# Patient Record
Sex: Female | Born: 2000 | Race: Black or African American | Hispanic: No | Marital: Single | Smoking: Current some day smoker
Health system: Southern US, Community
[De-identification: ages and names within clinical notes are randomized; demographics above are authoritative.]

---
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
## 2022-11-19 ENCOUNTER — Other Ambulatory Visit: Payer: Self-pay

## 2022-11-19 ENCOUNTER — Ambulatory Visit
Admission: EM | Admit: 2022-11-19 | Discharge: 2022-11-19 | Disposition: A | Discharge status: Home / Self Care | Payer: Medicaid Other | Attending: Emergency Medicine | Admitting: Emergency Medicine

## 2022-11-19 ENCOUNTER — Encounter: Payer: Self-pay | Admitting: *Deleted

## 2022-11-19 DIAGNOSIS — R112 Nausea with vomiting, unspecified: Secondary | ICD-10-CM | POA: Insufficient documentation

## 2022-11-19 DIAGNOSIS — K529 Noninfective gastroenteritis and colitis, unspecified: Secondary | ICD-10-CM | POA: Diagnosis not present

## 2022-11-19 LAB — POCT URINALYSIS DIP (MANUAL ENTRY)
Bilirubin, UA: NEGATIVE
Glucose, UA: NEGATIVE mg/dL
Ketones, POC UA: NEGATIVE mg/dL
Nitrite, UA: NEGATIVE
Protein Ur, POC: >=300 mg/dL — AB
Spec Grav, UA: 1.025 (ref 1.010–1.025)
Urobilinogen, UA: 0.2 U/dL
pH, UA: 5.5 (ref 5.0–8.0)

## 2022-11-19 LAB — POCT URINE PREGNANCY: Preg Test, Ur: NEGATIVE

## 2022-11-19 MED ORDER — ONDANSETRON HCL 4 MG/2ML IJ SOLN
4.0000 mg | Freq: Once | INTRAMUSCULAR | Status: AC
  Administered 2022-11-19: 4 mg via INTRAMUSCULAR

## 2022-11-19 MED ORDER — ONDANSETRON 4 MG PO TBDP
4.0000 mg | ORAL_TABLET | Freq: Once | ORAL | Status: AC
  Administered 2022-11-19: 4 mg via ORAL

## 2022-11-19 MED ORDER — ONDANSETRON HCL 4 MG PO TABS: 4.0000 mg | ORAL_TABLET | Freq: Four times a day (QID) | ORAL | 0 refills | Status: AC | PRN

## 2022-11-19 NOTE — Discharge Instructions (Addendum)
The zofran can be used every 6 hours as needed to settle the stomach. You can swallow the tablet (does not dissolve under tongue).  The most important treatment will be hydrating! Drink lots of water, and electrolyte drinks like Pedialyte, Gatorade, powerade  Stick with bland diet for a few days. (Crackers, toast, applesauce, banana, rice, etc)  Please go to the emergency department if symptoms worsen or if you can not tolerate any fluids by mouth.

## 2022-11-19 NOTE — ED Provider Notes (Signed)
[facility]   CSN: [id number] Arrival date & time: 11/19/22  1617     History   Chief Complaint Chief Complaint  Patient presents with   Emesis    HPI Valerie Sharp is a 22 y.o. female.  Here with 2 day history of nausea and vomiting Reports 8 episodes of emesis today. Has been unable to tolerate food or fluids. She is not having abdominal pain. Normal BMs, last was yesterday morning. No urinary symptoms. No fevers  Unknown if sick contacts. Sister had a cold recently  No recent travel  Started her menstrual cycle 4 days ago  Had a wendy's 4 for 4 last night  History reviewed. No pertinent past medical history.  There are no problems to display for this patient.   History reviewed. No pertinent surgical history.  OB History   No obstetric history on file.      Home Medications    Prior to Admission medications   Medication Sig Start Date End Date Taking? Authorizing Provider  ondansetron (ZOFRAN) 4 MG tablet Take 1 tablet (4 mg total) by mouth every 6 (six) hours as needed for nausea or vomiting. 11/19/22  Yes Simrah Chatham, Lurena Joiner, PA-C    Family History Family History  Family history unknown: Yes    Social History Social History   Tobacco Use   Smoking status: Some Days    Types: Cigars   Smokeless tobacco: Never   Tobacco comments:    Black & Milds  Vaping Use   Vaping status: Never Used  Substance Use Topics   Alcohol use: Yes    Comment: occasional   Drug use: Yes    Types: Marijuana     Allergies   Patient has no known allergies.   Review of Systems Review of Systems  Gastrointestinal:  Positive for vomiting.   Per HPI  Physical Exam Triage Vital Signs ED Triage Vitals  Encounter Vitals Group     BP 11/19/22 1631 (!) 96/57     Systolic BP Percentile --      Diastolic BP Percentile --      Pulse Rate 11/19/22 1631 (!) 122     Resp 11/19/22 1631 20     Temp 11/19/22 1631 97.9 F (36.6 C)     Temp Source 11/19/22  1631 Oral     SpO2 11/19/22 1631 98 %     Weight --      Height --      Head Circumference --      Peak Flow --      Pain Score 11/19/22 1627 8     Pain Loc --      Pain Education --      Exclude from Growth Chart --    No data found.  Updated Vital Signs BP 92/63   Pulse (!) 114   Temp 97.9 F (36.6 C) (Oral)   Resp 20   LMP 11/16/2022   SpO2 98%    Physical Exam Vitals and nursing note reviewed.  Constitutional:      Appearance: Normal appearance.  HENT:     Mouth/Throat:     Mouth: Mucous membranes are moist.     Pharynx: Oropharynx is clear.  Eyes:     Conjunctiva/sclera: Conjunctivae normal.  Cardiovascular:     Rate and Rhythm: Regular rhythm. Tachycardia present.     Pulses: Normal pulses.     Heart sounds: Normal heart sounds.  Pulmonary:     Effort: Pulmonary effort is  normal.     Breath sounds: Normal breath sounds.  Abdominal:     General: Bowel sounds are normal.     Palpations: Abdomen is soft.     Tenderness: There is no abdominal tenderness. There is no right CVA tenderness, left CVA tenderness, guarding or rebound.  Musculoskeletal:        General: Normal range of motion.  Skin:    General: Skin is warm and dry.  Neurological:     Mental Status: She is alert and oriented to person, place, and time.     UC Treatments / Results  Labs (all labs ordered are listed, but only abnormal results are displayed) Labs Reviewed  POCT URINALYSIS DIP (MANUAL ENTRY) - Abnormal; Notable for the following components:      Result Value   Clarity, UA turbid (*)    Blood, UA moderate (*)    Protein Ur, POC >=300 (*)    Leukocytes, UA Large (3+) (*)    All other components within normal limits  URINE CULTURE  POCT URINE PREGNANCY    EKG  Radiology No results found.  Procedures Procedures   Medications Ordered in UC Medications  ondansetron (ZOFRAN-ODT) disintegrating tablet 4 mg (4 mg Oral Given 11/19/22 1637)  ondansetron (ZOFRAN) injection 4  mg (4 mg Intramuscular Given 11/19/22 1710)    Initial Impression / Assessment and Plan / UC Course  I have reviewed the triage vital signs and the nursing notes.  Pertinent labs & imaging results that were available during my care of the patient were reviewed by me and considered in my medical decision making (see chart for details).  HR 120 on arrival, vomiting in triage   UPT negative UA moderate blood (on cycle) and large leuks.  Culture is pending. She does not have any urinary symptoms, abdominal pain or CVA tenderness.   Zofran ODT given, patient then had episode of emesis IM dose given PO challenge successful. She is feeling better without nausea. She is not ill-appearing.  Still tachycardic but attribute this to dehydration secondary to decreased oral intake. She has no chest pain or shortness of breath, pulses are strong and equal.  Sent zofran to use q6 hours prn, increase fluids, bland diet if tolerated Strict return and ED precautions discussed Patient is agreeable to plan, no questions at this time  Final Clinical Impressions(s) / UC Diagnoses   Final diagnoses:  Nausea and vomiting, unspecified vomiting type  Gastroenteritis     Discharge Instructions      The zofran can be used every 6 hours as needed to settle the stomach. You can swallow the tablet (does not dissolve under tongue).  The most important treatment will be hydrating! Drink lots of water, and electrolyte drinks like Pedialyte, Gatorade, powerade  Stick with bland diet for a few days. (Crackers, toast, applesauce, banana, rice, etc)  Please go to the emergency department if symptoms worsen or if you can not tolerate any fluids by mouth.      ED Prescriptions     Medication Sig Dispense Auth. Provider   ondansetron (ZOFRAN) 4 MG tablet Take 1 tablet (4 mg total) by mouth every 6 (six) hours as needed for nausea or vomiting. 12 tablet Windel Keziah, Lurena Joiner, PA-C      PDMP not reviewed this  encounter.   Parthenia Tellefsen, Lurena Joiner, [place] 11/19/22 1755
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
## 2022-11-21 ENCOUNTER — Telehealth (HOSPITAL_COMMUNITY): Payer: Self-pay | Admitting: Emergency Medicine

## 2022-11-21 LAB — URINE CULTURE: Culture: >=100000 — AB

## 2022-11-21 MED ORDER — SULFAMETHOXAZOLE-TRIMETHOPRIM 800-160 MG PO TABS: 1.0000 | ORAL_TABLET | Freq: Two times a day (BID) | ORAL | 0 refills | Status: AC

## 2022-11-21 NOTE — Telephone Encounter (Signed)
E.Coli on urine culture. Sent Bactrim BID x 3 days

## 2022-11-23 NOTE — Plan of Care (Signed)
[facility] Tonsillectomy/Adenoidectomy, Postoperative PEDS care plan entered in error.
